# Patient Record
Sex: Male | Born: 1995 | Race: Black or African American | Hispanic: No | Marital: Single | Smoking: Never smoker
Health system: Southern US, Community
[De-identification: ages and names within clinical notes are randomized; demographics above are authoritative.]

## PROBLEM LIST (undated history)

## (undated) DIAGNOSIS — J45909 Unspecified asthma, uncomplicated: Secondary | ICD-10-CM

---
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
## 2016-06-09 ENCOUNTER — Ambulatory Visit (HOSPITAL_COMMUNITY)
Admission: EM | Admit: 2016-06-09 | Discharge: 2016-06-09 | Disposition: A | Discharge status: Home / Self Care | Payer: Self-pay | Attending: Emergency Medicine | Admitting: Emergency Medicine

## 2016-06-09 ENCOUNTER — Encounter (HOSPITAL_COMMUNITY): Payer: Self-pay | Admitting: *Deleted

## 2016-06-09 DIAGNOSIS — B9789 Other viral agents as the cause of diseases classified elsewhere: Secondary | ICD-10-CM

## 2016-06-09 DIAGNOSIS — J069 Acute upper respiratory infection, unspecified: Secondary | ICD-10-CM

## 2016-06-09 MED ORDER — PREDNISONE 50 MG PO TABS: ORAL_TABLET | ORAL | 0 refills | Status: DC

## 2016-06-09 MED ORDER — LORATADINE 10 MG PO TABS: 10.0000 mg | ORAL_TABLET | Freq: Every day | ORAL | 0 refills | Status: DC

## 2016-06-09 MED ORDER — BENZONATATE 100 MG PO CAPS: 100.0000 mg | ORAL_CAPSULE | Freq: Three times a day (TID) | ORAL | 0 refills | Status: DC

## 2016-06-09 NOTE — Discharge Instructions (Signed)
You have a upper respiratory viral infection. You may need to use your inhaler more frequently over the next few days. Take prednisone daily for 5 days. This will help with the cough and pain in your chest. Take loratadine daily to help with congestion. Use the Tessalon 3 times a day as needed for cough. You should start to feel better in the next 3 days. Follow-up as needed.

## 2016-06-09 NOTE — ED Provider Notes (Signed)
[facility]    CSN: [id number] Arrival date & time: 06/09/16  1040     History   Chief Complaint Chief Complaint  Patient presents with  . URI    HPI Steve PacasDashaun Erling ConteSwain is a 20 y.o. male.   HPI  Steve Esparza is a 20 year old man here for evaluation of cough. His symptoms started 3 days ago with cough, nasal congestion, runny nose, and intermittent sore throat. Steve Esparza reports fevers at home on Sunday and Tuesday. Developed pain in his sternal chest with coughing. Steve Esparza denies any shortness of breath. No nausea, vomiting, diarrhea. No ear pain. Steve Esparza has been drinking hot tea without improvement.  Past Medical History:  Diagnosis Date  . Asthma     There are no active problems to display for this patient.   History reviewed. No pertinent surgical history.     Home Medications    Prior to Admission medications   Medication Sig Start Date End Date Taking? Authorizing Provider  albuterol (PROVENTIL HFA;VENTOLIN HFA) 108 (90 BASE) MCG/ACT inhaler Inhale 1-2 puffs into the lungs every 6 (six) hours as needed for wheezing or shortness of breath. 06/04/13   Irish EldersKelly Walker, FNP  beclomethasone (QVAR) 80 MCG/ACT inhaler Inhale 1 puff into the lungs 2 (two) times daily.    Historical Provider, MD  benzonatate (TESSALON) 100 MG capsule Take 1 capsule (100 mg total) by mouth every 8 (eight) hours. 06/09/16   Charm RingsErin J Amiree No, MD  loratadine (CLARITIN) 10 MG tablet Take 1 tablet (10 mg total) by mouth daily. 06/09/16   Charm RingsErin J Raudel Bazen, MD  predniSONE (DELTASONE) 50 MG tablet Take 1 pill daily for 5 days. 06/09/16   Charm RingsErin J Obed Samek, MD    Family History History reviewed. No pertinent family history.  Social History Social History  Substance Use Topics  . Smoking status: Never Smoker  . Smokeless tobacco: Not on file  . Alcohol use No     Allergies   Other; Peanut-containing drug products; and Shellfish allergy   Review of Systems Review of Systems As in history of present illness  Physical  Exam Triage Vital Signs ED Triage Vitals  Enc Vitals Group     BP      Pulse      Resp      Temp      Temp src      SpO2      Weight      Height      Head Circumference      Peak Flow      Pain Score      Pain Loc      Pain Edu?      Excl. in GC?    No data found.   Updated Vital Signs BP 118/72 (BP Location: Right Arm)   Pulse 78   Temp 98.6 F (37 C) (Oral)   Resp 20   SpO2 100%   Visual Acuity Right Eye Distance:   Left Eye Distance:   Bilateral Distance:    Right Eye Near:   Left Eye Near:    Bilateral Near:     Physical Exam  Constitutional: Steve Esparza is oriented to person, place, and time. Steve Esparza appears well-developed and well-nourished. No distress.  HENT:  Mouth/Throat: Oropharynx is clear and moist. No oropharyngeal exudate.  Nasal mucosa is erythematous and boggy. TMs normal bilaterally.  Neck: Neck supple.  Cardiovascular: Normal rate, regular rhythm and normal heart sounds.   No murmur heard. Pulmonary/Chest: Effort normal and breath  sounds normal. No respiratory distress. Steve Esparza has no wheezes. Steve Esparza has no rales. Steve Esparza exhibits tenderness (distal sternum, reproduces pain).  Lymphadenopathy:    Steve Esparza has no cervical adenopathy.  Neurological: Steve Esparza is alert and oriented to person, place, and time.     UC Treatments / Results  Labs (all labs ordered are listed, but only abnormal results are displayed) Labs Reviewed - No data to display  EKG  EKG Interpretation None       Radiology No results found.  Procedures Procedures (including critical care time)  Medications Ordered in UC Medications - No data to display   Initial Impression / Assessment and Plan / UC Course  I have reviewed the triage vital signs and the nursing notes.  Pertinent labs & imaging results that were available during my care of the patient were reviewed by me and considered in my medical decision making (see chart for details).  Clinical Course     No sign of pneumonia. We'll  treat symptomatically with prednisone, loratadine, and Tessalon. Work note provided. Expect improvement in 3 days. Follow-up as needed.  Final Clinical Impressions(s) / UC Diagnoses   Final diagnoses:  Viral URI with cough    New Prescriptions New Prescriptions   BENZONATATE (TESSALON) 100 MG CAPSULE    Take 1 capsule (100 mg total) by mouth every 8 (eight) hours.   LORATADINE (CLARITIN) 10 MG TABLET    Take 1 tablet (10 mg total) by mouth daily.   PREDNISONE (DELTASONE) 50 MG TABLET    Take 1 pill daily for 5 days.     Charm RingsErin J Nabiha Planck, MD 06/09/16 1149

## 2016-06-09 NOTE — ED Triage Notes (Signed)
Pt  Reports  A  History   Of    Cough   /   Congested    And      Symptoms   X     sev  Days  Pt  Sitting  Upright  On the  Exam table  Speaking  In  Complete    sentances      In no  Severe   Distress

## 2016-12-29 ENCOUNTER — Encounter (HOSPITAL_COMMUNITY): Payer: Self-pay

## 2016-12-29 ENCOUNTER — Ambulatory Visit (HOSPITAL_COMMUNITY)
Admission: EM | Admit: 2016-12-29 | Discharge: 2016-12-29 | Disposition: A | Discharge status: Home / Self Care | Payer: Medicaid Other | Attending: Family Medicine | Admitting: Family Medicine

## 2016-12-29 DIAGNOSIS — S93401A Sprain of unspecified ligament of right ankle, initial encounter: Secondary | ICD-10-CM

## 2016-12-29 MED ORDER — NAPROXEN 500 MG PO TABS: 500.0000 mg | ORAL_TABLET | Freq: Two times a day (BID) | ORAL | 0 refills | Status: DC

## 2016-12-29 NOTE — ED Triage Notes (Signed)
[facility] of [facility] pain since February pt was seen and had to wear a boot but ankle pain continues on/off but swelling is constant, pt denies any injuries

## 2016-12-29 NOTE — ED Provider Notes (Signed)
CSN: [id number]     Arrival date & time 12/29/16  1805 History   None    Chief Complaint  Patient presents with  . Ankle Pain   (Consider location/radiation/quality/duration/timing/severity/associated sxs/prior Treatment) Patient states he rolled his ankle a few weeks ago and he is having some lateral ankle discomfort.   The history is provided by the patient.  Ankle Pain  Location:  Ankle Time since incident:  2 weeks Injury: yes   Ankle location:  R ankle Pain details:    Severity:  Moderate   Duration:  2 weeks   Timing:  Intermittent Chronicity:  Recurrent   Past Medical History:  Diagnosis Date  . Asthma    History reviewed. No pertinent surgical history. History reviewed. No pertinent family history. Social History  Substance Use Topics  . Smoking status: Never Smoker  . Smokeless tobacco: Never Used  . Alcohol use No    Review of Systems  Constitutional: Negative.   HENT: Negative.   Eyes: Negative.   Respiratory: Negative.   Cardiovascular: Negative.   Gastrointestinal: Negative.   Endocrine: Negative.   Genitourinary: Negative.   Musculoskeletal: Positive for arthralgias.  Allergic/Immunologic: Negative.   Neurological: Negative.     Allergies  Other; Peanut-containing drug products; and Shellfish allergy  Home Medications   Prior to Admission medications   Medication Sig Start Date End Date Taking? Authorizing Provider  loratadine (CLARITIN) 10 MG tablet Take 1 tablet (10 mg total) by mouth daily. 06/09/16  Yes Charm RingsHonig, Erin J, MD  albuterol (PROVENTIL HFA;VENTOLIN HFA) 108 (90 BASE) MCG/ACT inhaler Inhale 1-2 puffs into the lungs every 6 (six) hours as needed for wheezing or shortness of breath. 06/04/13   Irish EldersWalker, Kelly, FNP  beclomethasone (QVAR) 80 MCG/ACT inhaler Inhale 1 puff into the lungs 2 (two) times daily.    [provider]  benzonatate (TESSALON) 100 MG capsule Take 1 capsule (100 mg total) by mouth every 8 (eight) hours. 06/09/16    Charm RingsHonig, Erin J, MD  naproxen (NAPROSYN) 500 MG tablet Take 1 tablet (500 mg total) by mouth 2 (two) times daily with a meal. 12/29/16   [place], Anselm PancoastWilliam J, FNP  predniSONE (DELTASONE) 50 MG tablet Take 1 pill daily for 5 days. 06/09/16   Charm RingsHonig, Erin J, MD   Meds Ordered and Administered this Visit  Medications - No data to display  BP 130/86 (BP Location: Right Arm)   Pulse 70   Temp 98.5 F (36.9 C) (Oral)   Resp 16   SpO2 100%  No data found.   Physical Exam  Constitutional: He appears well-developed and well-nourished.  HENT:  Head: Normocephalic and atraumatic.  Eyes: Conjunctivae and EOM are normal. Pupils are equal, round, and reactive to light.  Neck: Normal range of motion. Neck supple.  Cardiovascular: Normal rate, regular rhythm and normal heart sounds.   Pulmonary/Chest: Effort normal.  Musculoskeletal: He exhibits tenderness.  Right lateral ankle TTP no deformity and no swelling.  Nursing note and vitals reviewed.   Urgent Care Course     Procedures (including critical care time)  Labs Review Labs Reviewed - No data to display  Imaging Review No results found.   Visual Acuity Review  Right Eye Distance:   Left Eye Distance:   Bilateral Distance:    Right Eye Near:   Left Eye Near:    Bilateral Near:         MDM   1. Sprain of right ankle, unspecified ligament, initial encounter  Naprosyn 500mg  one po bid x 10 days       Deatra Canter, [place] 12/29/16 1947

## 2017-02-20 ENCOUNTER — Emergency Department (HOSPITAL_COMMUNITY): Payer: Self-pay

## 2017-02-20 ENCOUNTER — Encounter (HOSPITAL_COMMUNITY): Payer: Self-pay | Admitting: *Deleted

## 2017-02-20 ENCOUNTER — Emergency Department (HOSPITAL_COMMUNITY)
Admission: EM | Admit: 2017-02-20 | Discharge: 2017-02-20 | Disposition: A | Discharge status: Home / Self Care | Payer: Self-pay | Attending: Emergency Medicine | Admitting: Emergency Medicine

## 2017-02-20 DIAGNOSIS — Z79899 Other long term (current) drug therapy: Secondary | ICD-10-CM | POA: Insufficient documentation

## 2017-02-20 DIAGNOSIS — Y9367 Activity, basketball: Secondary | ICD-10-CM | POA: Insufficient documentation

## 2017-02-20 DIAGNOSIS — Z91010 Allergy to peanuts: Secondary | ICD-10-CM | POA: Insufficient documentation

## 2017-02-20 DIAGNOSIS — S43491A Other sprain of right shoulder joint, initial encounter: Secondary | ICD-10-CM | POA: Insufficient documentation

## 2017-02-20 DIAGNOSIS — Y92310 Basketball court as the place of occurrence of the external cause: Secondary | ICD-10-CM | POA: Insufficient documentation

## 2017-02-20 DIAGNOSIS — Y998 Other external cause status: Secondary | ICD-10-CM | POA: Insufficient documentation

## 2017-02-20 DIAGNOSIS — J45909 Unspecified asthma, uncomplicated: Secondary | ICD-10-CM | POA: Insufficient documentation

## 2017-02-20 DIAGNOSIS — W51XXXA Accidental striking against or bumped into by another person, initial encounter: Secondary | ICD-10-CM | POA: Insufficient documentation

## 2017-02-20 MED ORDER — IBUPROFEN 800 MG PO TABS
800.0000 mg | ORAL_TABLET | Freq: Once | ORAL | Status: AC
  Administered 2017-02-20: 800 mg via ORAL
  Filled 2017-02-20: qty 1

## 2017-02-20 MED ORDER — IBUPROFEN 800 MG PO TABS: 800.0000 mg | ORAL_TABLET | Freq: Three times a day (TID) | ORAL | 0 refills | Status: DC | PRN

## 2017-02-20 NOTE — ED Notes (Signed)
Declined W/C at D/C and was escorted to lobby by RN. 

## 2017-02-20 NOTE — ED Provider Notes (Signed)
[facility] Provider Note   CSN: [id number] Arrival date & time: 02/20/17  0909     History   Chief Complaint Chief Complaint  Patient presents with  . Shoulder Injury    HPI Linell Salminen is a 21 y.o. male presenting after basketball injury in which he fell backwards to the ground and the other player fell on top of his right shoulder. He is here with aching right shoulder pain. Denies any head trauma, loss of consciousness or other injuries, no numbness or weakness in his upper extremities.   HPI  Past Medical History:  Diagnosis Date  . Asthma     There are no active problems to display for this patient.   History reviewed. No pertinent surgical history.     Home Medications    Prior to Admission medications   Medication Sig Start Date End Date Taking? Authorizing Provider  albuterol (PROVENTIL HFA;VENTOLIN HFA) 108 (90 BASE) MCG/ACT inhaler Inhale 1-2 puffs into the lungs every 6 (six) hours as needed for wheezing or shortness of breath. 06/04/13   Irish Elders, FNP  beclomethasone (QVAR) 80 MCG/ACT inhaler Inhale 1 puff into the lungs 2 (two) times daily.    [provider]  benzonatate (TESSALON) 100 MG capsule Take 1 capsule (100 mg total) by mouth every 8 (eight) hours. 06/09/16   Charm Rings, MD  ibuprofen (ADVIL,MOTRIN) 800 MG tablet Take 1 tablet (800 mg total) by mouth every 8 (eight) hours as needed. 02/20/17   Georgiana Shore, PA-C  loratadine (CLARITIN) 10 MG tablet Take 1 tablet (10 mg total) by mouth daily. 06/09/16   Charm Rings, MD  naproxen (NAPROSYN) 500 MG tablet Take 1 tablet (500 mg total) by mouth 2 (two) times daily with a meal. 12/29/16   Oxford, Anselm Pancoast, FNP  predniSONE (DELTASONE) 50 MG tablet Take 1 pill daily for 5 days. 06/09/16   Charm Rings, MD    Family History No family history on file.  Social History Social History  Substance Use Topics  . Smoking status: Never Smoker  . Smokeless tobacco: Never Used    . Alcohol use No     Allergies   Other; Peanut-containing drug products; and Shellfish allergy   Review of Systems Review of Systems  Musculoskeletal: Positive for arthralgias. Negative for joint swelling, neck pain and neck stiffness.  Skin: Negative for color change, pallor and wound.  Neurological: Negative for weakness and numbness.     Physical Exam Updated Vital Signs BP (!) 147/76   Pulse 70   Temp 98.1 F (36.7 C) (Oral)   Resp 16   Wt 86.2 kg (190 lb)   SpO2 100%   Physical Exam  Constitutional: He appears well-developed and well-nourished. No distress.  Afebrile, nontoxic-appearing, sitting comfortably in bed in no acute distress.  HENT:  Head: Normocephalic and atraumatic.  Eyes: Conjunctivae are normal.  Neck: Neck supple.  Cardiovascular: Normal rate, regular rhythm, normal heart sounds and intact distal pulses.   No murmur heard. Pulmonary/Chest: Effort normal and breath sounds normal. No respiratory distress. He has no wheezes. He has no rales.  Abdominal: He exhibits no distension.  Musculoskeletal: Normal range of motion. He exhibits tenderness. He exhibits no edema or deformity.  Tenderness over the acromioclavicular joint. No bony tenderness elsewhere. Full range of motion of the shoulder.  Neurological: He is alert. No sensory deficit. He exhibits normal muscle tone.  5 out of 5 strength to abduction and abduction, elbow flexion and  extension, grips bilaterally. Sensation intact. strong radial pulses bilaterally. Vascularly intact  Skin: Skin is warm and dry. No rash noted. He is not diaphoretic. No erythema. No pallor.  Psychiatric: He has a normal mood and affect.  Nursing note and vitals reviewed.    ED Treatments / Results  Labs (all labs ordered are listed, but only abnormal results are displayed) Labs Reviewed - No data to display  EKG  EKG Interpretation None       Radiology Dg Shoulder Right  Result Date:  02/20/2017 CLINICAL DATA:  Pt was playing basketball yesterday when he fell and someone fell on him injuring his right shoulder. Pt has continued pain in right shoulder, increased with movement (most pain when lifting arm) and pt has bruise to shoulder. No relief with ibuprofen and ice at home. No previous injuries to right shoulder before. EXAM: RIGHT SHOULDER - 2+ VIEW COMPARISON:  None. FINDINGS: No fracture. On the AP view, the distal clavicle projects superior to the acromion by approximately 7 mm. This suggests an AC joint sprain. The glenohumeral joint is normally spaced and aligned. Soft tissues are unremarkable. IMPRESSION: 1. No fracture or dislocation. 2. Findings suggest an [facility] joint sprain. Electronically Signed   By: Amie Portland M.D.   On: 02/20/2017 09:41    Procedures Procedures (including critical care time)  Medications Ordered in ED Medications  ibuprofen (ADVIL,MOTRIN) tablet 800 mg (not administered)     Initial Impression / Assessment and Plan / ED Course  I have reviewed the triage vital signs and the nursing notes.  Pertinent labs & imaging results that were available during my care of the patient were reviewed by me and considered in my medical decision making (see chart for details).    Patient presents with right shoulder sprain after a basketball injury. No other injuries.  X-ray without evidence of dislocation or fracture. Suspicious for acromioclavicular sprain.  Patient's pain was managed while in the emergency department. He is otherwise well-appearing nontoxic with normal vital signs and stable.  Patient was provided with a sling for comfort. Rice protocol and considered and discussed.  Discharge home with follow-up with PCP as needed.  Discussed strict return precautions and advised to return to the emergency department if experiencing any new or worsening symptoms. Instructions were understood and patient agreed with discharge plan. Final Clinical  Impressions(s) / ED Diagnoses   Final diagnoses:  Sprain of other part of right shoulder region, initial encounter    New Prescriptions New Prescriptions   IBUPROFEN (ADVIL,MOTRIN) 800 MG TABLET    Take 1 tablet (800 mg total) by mouth every 8 (eight) hours as needed.     Georgiana Shore, PA-C 02/20/17 1226    Arby Barrette, MD 03/01/17 1739

## 2017-02-20 NOTE — ED Triage Notes (Signed)
PT updated on X-ray results

## 2017-02-20 NOTE — Discharge Instructions (Signed)
As discussed, wear your sling for support and comfort.  RICE protocol as outlines in these instructions.  Ibuprofen for pain and swelling.  Follow up with Primary care provider in a week as needed. Return if worsening, loss of sensation, numbness, or other concerning symptoms in the meantime.

## 2017-02-20 NOTE — ED Triage Notes (Signed)
Pt was playing basketball yesterday when he fell and someone fell on him injuring his right shoulder.  Pt has continued pain in right shoulder, increased with movement (most pain when lifting arm) and pt has bruise to shoulder.  No relief with ibuprofen and ice at home.

## 2017-04-16 ENCOUNTER — Ambulatory Visit (HOSPITAL_COMMUNITY)
Admission: EM | Admit: 2017-04-16 | Discharge: 2017-04-16 | Disposition: A | Discharge status: Home / Self Care | Payer: Self-pay | Attending: Internal Medicine | Admitting: Internal Medicine

## 2017-04-16 ENCOUNTER — Encounter (HOSPITAL_COMMUNITY): Payer: Self-pay | Admitting: Emergency Medicine

## 2017-04-16 DIAGNOSIS — Z23 Encounter for immunization: Secondary | ICD-10-CM

## 2017-04-16 DIAGNOSIS — S81802A Unspecified open wound, left lower leg, initial encounter: Secondary | ICD-10-CM

## 2017-04-16 DIAGNOSIS — W2209XA Striking against other stationary object, initial encounter: Secondary | ICD-10-CM

## 2017-04-16 DIAGNOSIS — S8011XA Contusion of right lower leg, initial encounter: Secondary | ICD-10-CM

## 2017-04-16 DIAGNOSIS — S81812A Laceration without foreign body, left lower leg, initial encounter: Secondary | ICD-10-CM

## 2017-04-16 MED ORDER — LIDOCAINE-EPINEPHRINE-TETRACAINE (LET) SOLUTION
NASAL | Status: AC
  Filled 2017-04-16: qty 3

## 2017-04-16 MED ORDER — TETANUS-DIPHTH-ACELL PERTUSSIS 5-2.5-18.5 LF-MCG/0.5 IM SUSP
0.5000 mL | Freq: Once | INTRAMUSCULAR | Status: AC
  Administered 2017-04-16: 0.5 mL via INTRAMUSCULAR

## 2017-04-16 MED ORDER — TETANUS-DIPHTH-ACELL PERTUSSIS 5-2.5-18.5 LF-MCG/0.5 IM SUSP
INTRAMUSCULAR | Status: AC
  Filled 2017-04-16: qty 0.5

## 2017-04-16 MED ORDER — LIDOCAINE-EPINEPHRINE-TETRACAINE (LET) SOLUTION
3.0000 mL | Freq: Once | NASAL | Status: AC
  Administered 2017-04-16: 3 mL via TOPICAL

## 2017-04-16 NOTE — ED Triage Notes (Signed)
Playing basketball today, ran into bleachers and has laceration to left lower leg.  Bleeding controlled

## 2017-04-16 NOTE — Discharge Instructions (Addendum)
Wash wound on left lower leg gently with soap/water 1-2 times daily, apply antibiotic ointment and bandage.  Recheck if any increasing redness/swelling/pain/drainage or new fever>100.5, or if not starting to improve in a few days.  Anticipate gradual healing of the wound over the next 2-4 weeks; bumps and bruises on the lower legs can take longer than that to resolve.  Tetanus booster given today at the urgent care.

## 2017-04-18 NOTE — ED Provider Notes (Signed)
[facility]    CSN: [id number] Arrival date & time: 04/16/17  1334     History   Chief Complaint Chief Complaint  Patient presents with  . Laceration    HPI Steve Esparza is a 21 y.o. male. He presents today after barking both shins on some bleachers at a basketball game this afternoon, just walked into the bleachers.  R mid shin with a small swollen/tender lump; L mid shin with a small skin avulsion (bleeding controlled).  No other injuries reported.      HPI  Past Medical History:  Diagnosis Date  . Asthma    History reviewed. No pertinent surgical history.     Home Medications    Prior to Admission medications   Medication Sig Start Date End Date Taking? Authorizing Provider  albuterol (PROVENTIL HFA;VENTOLIN HFA) 108 (90 BASE) MCG/ACT inhaler Inhale 1-2 puffs into the lungs every 6 (six) hours as needed for wheezing or shortness of breath. 06/04/13   Irish Elders, FNP  beclomethasone (QVAR) 80 MCG/ACT inhaler Inhale 1 puff into the lungs 2 (two) times daily.    [provider]  benzonatate (TESSALON) 100 MG capsule Take 1 capsule (100 mg total) by mouth every 8 (eight) hours. 06/09/16   Charm Rings, MD  ibuprofen (ADVIL,MOTRIN) 800 MG tablet Take 1 tablet (800 mg total) by mouth every 8 (eight) hours as needed. 02/20/17   Georgiana Shore, PA-C  loratadine (CLARITIN) 10 MG tablet Take 1 tablet (10 mg total) by mouth daily. 06/09/16   Charm Rings, MD  naproxen (NAPROSYN) 500 MG tablet Take 1 tablet (500 mg total) by mouth 2 (two) times daily with a meal. 12/29/16   Oxford, Anselm Pancoast, FNP    Family History No family history on file.  Social History Social History  Substance Use Topics  . Smoking status: Never Smoker  . Smokeless tobacco: Never Used  . Alcohol use No     Allergies   Other; Peanut-containing drug products; and Shellfish allergy   Review of Systems Review of Systems  All other systems reviewed and are  negative.    Physical Exam Triage Vital Signs ED Triage Vitals  Enc Vitals Group     BP 04/16/17 1437 (!) 108/58     Pulse Rate 04/16/17 1437 78     Resp 04/16/17 1437 18     Temp 04/16/17 1437 98.8 F (37.1 C)     Temp Source 04/16/17 1437 Oral     SpO2 04/16/17 1437 100 %     Weight --      Height --      Pain Score 04/16/17 1435 6     Pain Loc --    Updated Vital Signs BP (!) 108/58 (BP Location: Left Arm)   Pulse 78   Temp 98.8 F (37.1 C) (Oral)   Resp 18   SpO2 100%   Physical Exam  Constitutional: He is oriented to person, place, and time. No distress.  Alert, nicely groomed  HENT:  Head: Atraumatic.  Eyes:  Conjugate gaze, no eye redness/drainage  Neck: Neck supple.  Cardiovascular: Normal rate.   Pulmonary/Chest: No respiratory distress.  Abdominal: He exhibits no distension.  Musculoskeletal: Normal range of motion.  R mid shin with a 1.5" tender/bruised swelling  L mid shin with a 0.5" area of avulsed skin.  Neurological: He is alert and oriented to person, place, and time.  Able to walk into the urgent care independently.   Skin: Skin  is warm and dry.  No cyanosis  Nursing note and vitals reviewed.    UC Treatments / Results   Procedures Procedures (including critical care time)  Medications Ordered in UC Medications  lidocaine-EPINEPHrine-tetracaine (LET) solution (3 mLs Topical Given 04/16/17 1551)  Tdap (BOOSTRIX) injection 0.5 mL (0.5 mLs Intramuscular Given 04/16/17 1628)   Washed wound on L midshin with hibiclens/saline and irrigated with saline.  Wound was not suturable.  Antibiotic ointment and a bandage applied.      Final Clinical Impressions(s) / UC Diagnoses   Final diagnoses:  Contusion of right lower leg, initial encounter  Avulsion of skin of left lower leg, initial encounter   Wash wound on left lower leg gently with soap/water 1-2 times daily, apply antibiotic ointment and bandage.  Recheck if any increasing  redness/swelling/pain/drainage or new fever>100.5, or if not starting to improve in a few days.  Anticipate gradual healing of the wound over the next 2-4 weeks; bumps and bruises on the lower legs can take longer than that to resolve.  Tetanus booster given today at the urgent care.     Controlled Substance Prescriptions [place] Controlled Substance Registry consulted? No   Eustace Moore, MD 04/18/17 [phone]

## 2017-12-29 IMAGING — DX DG SHOULDER 2+V*R*
3 series · 3 of 3 positions shown · non-contrast
Comparison: None.

CLINICAL DATA: Pt was playing basketball yesterday when he fell and
someone fell on him injuring his right shoulder. Pt has continued
pain in right shoulder, increased with movement (most pain when
lifting arm) and pt has bruise to shoulder. No relief with ibuprofen
and ice at home. No previous injuries to right shoulder before.

EXAM:
RIGHT SHOULDER - 2+ VIEW

[shoulder grashey]
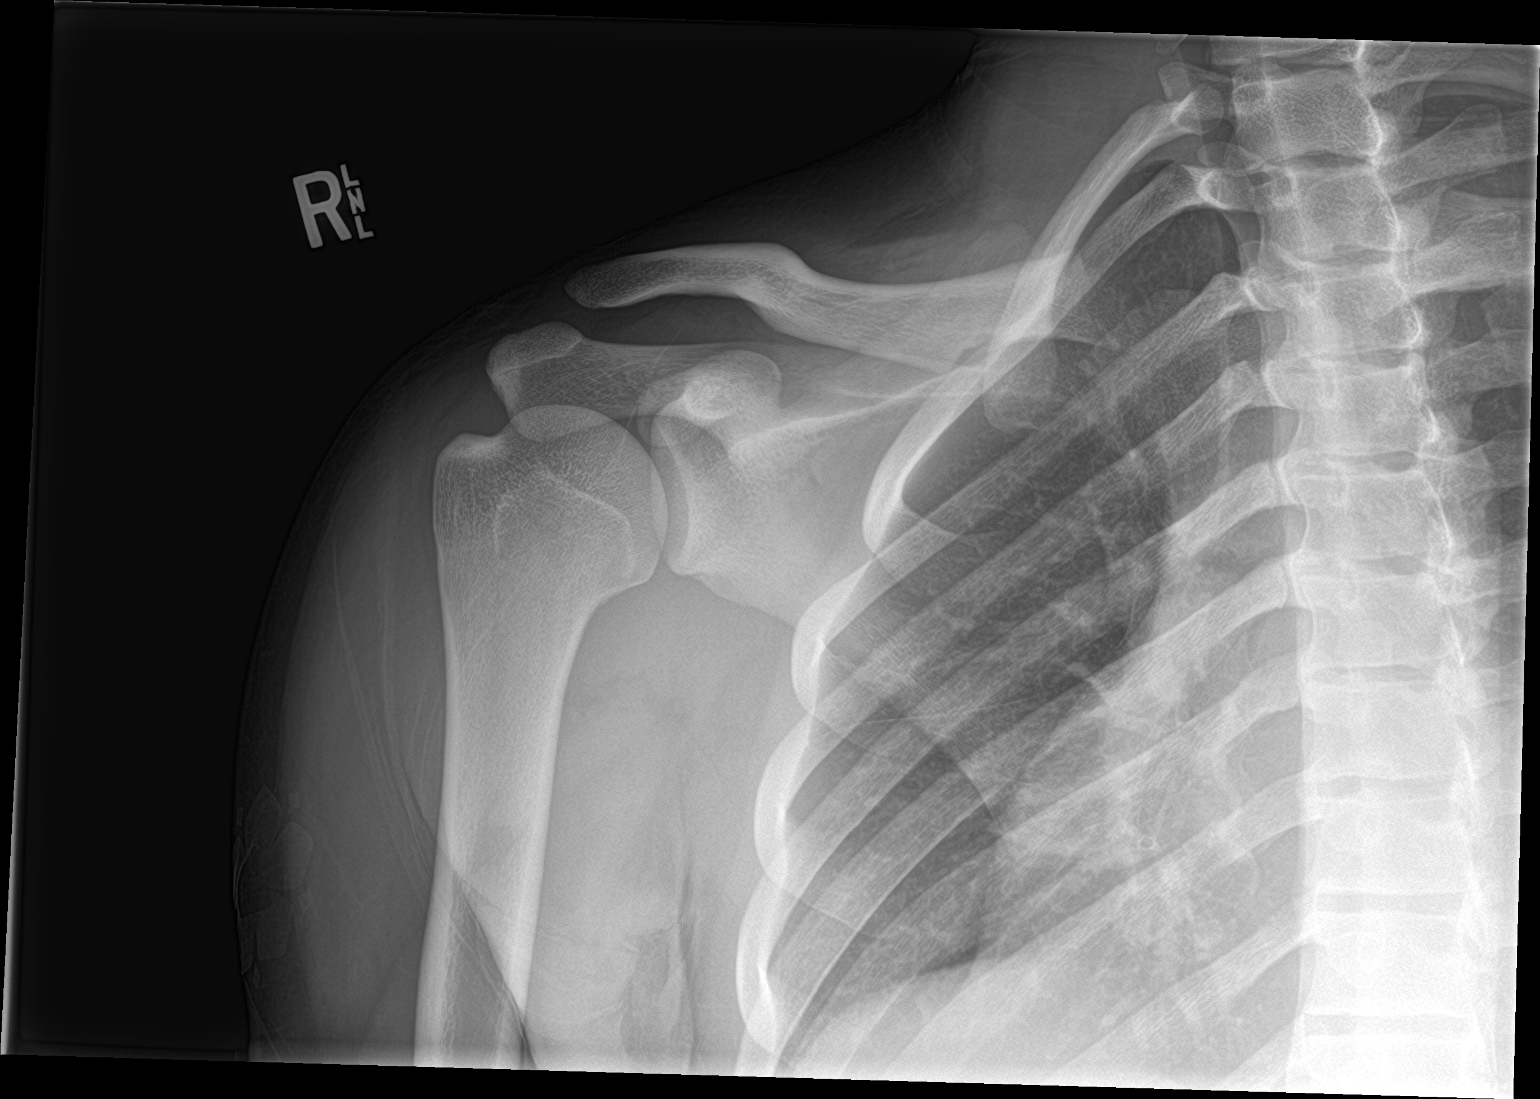

[shoulder y view]
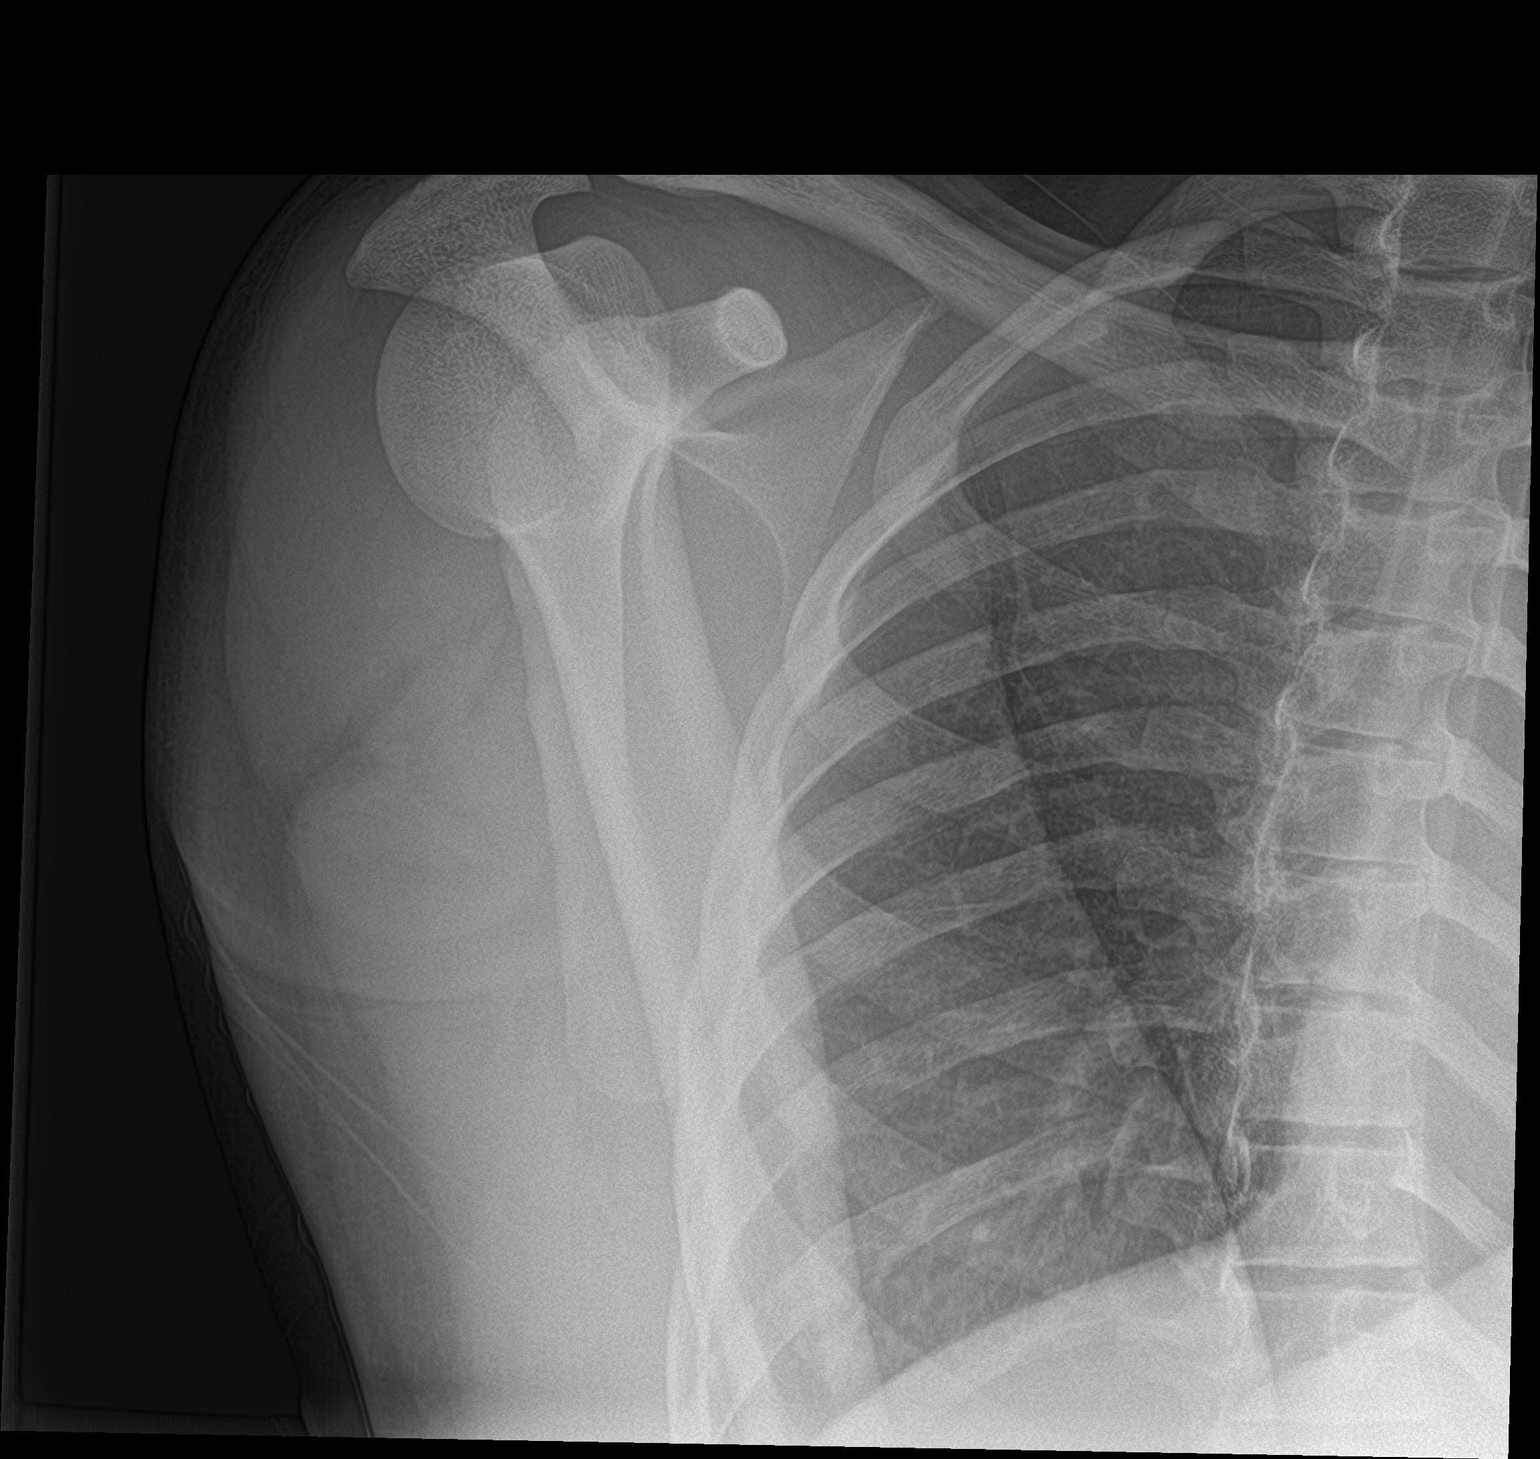

[shoulder axillary]
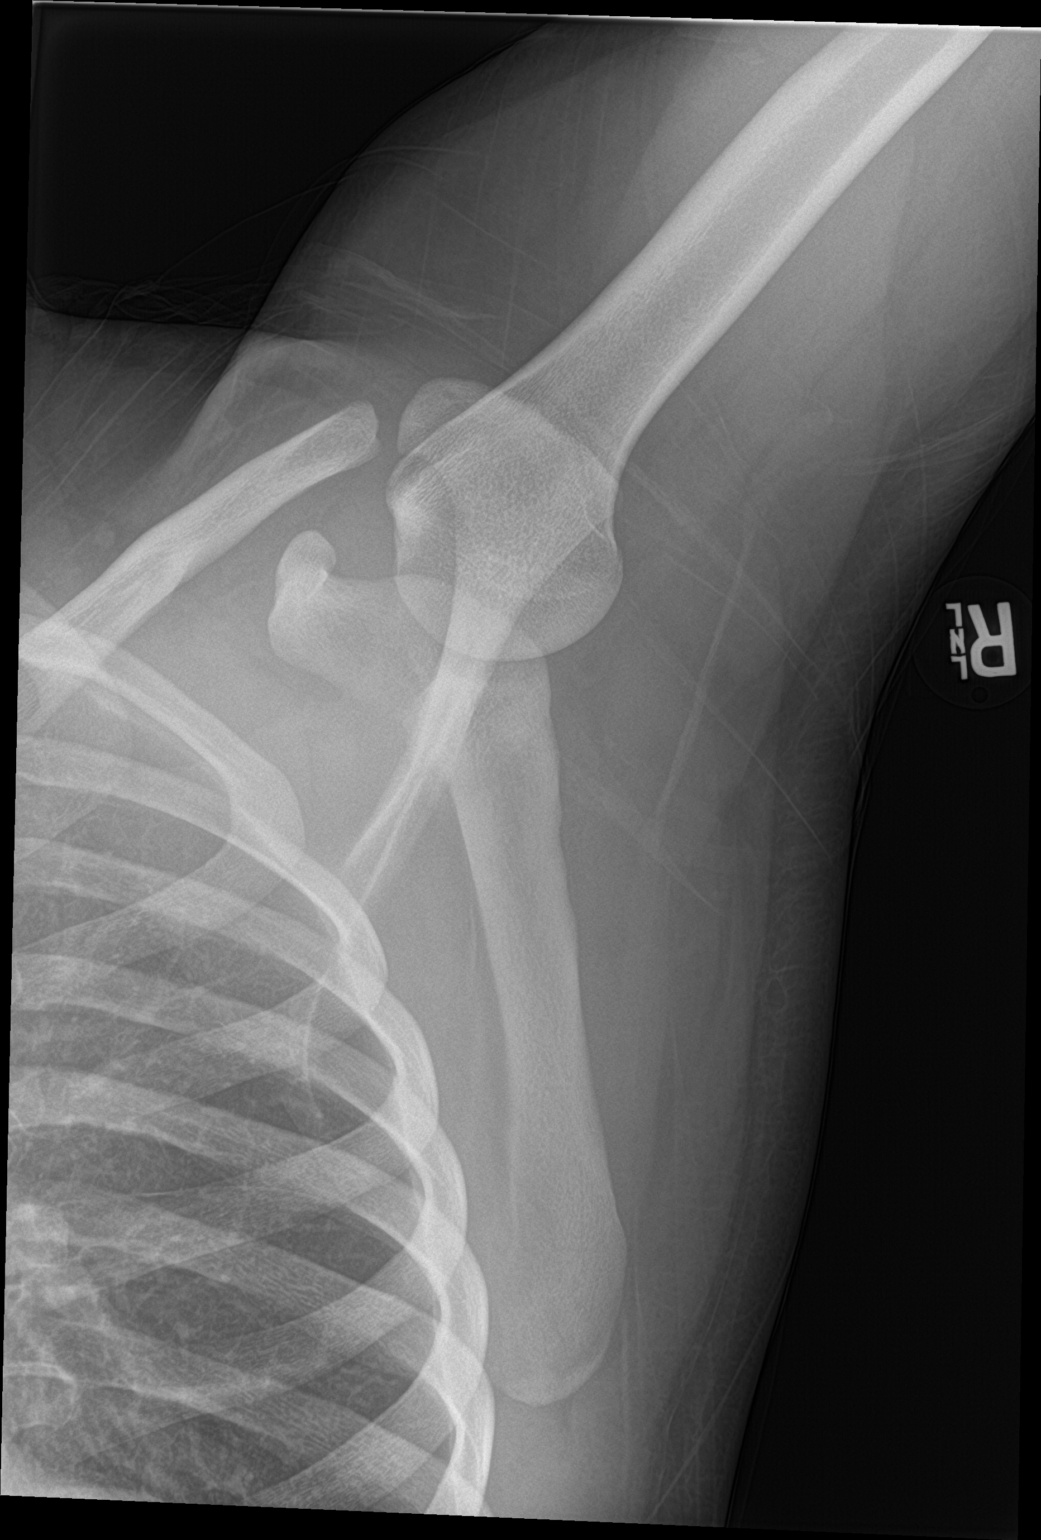

[3 of 3 positions shown; findings below may reference images not displayed]

FINDINGS: No fracture.

On the AP view, the distal clavicle projects superior to the
acromion by approximately 7 mm. This suggests an AC joint sprain.

The glenohumeral joint is normally spaced and aligned.

Soft tissues are unremarkable.
IMPRESSION: 1. No fracture or dislocation.
2. Findings suggest an AC joint sprain.

## 2018-06-08 ENCOUNTER — Ambulatory Visit (HOSPITAL_COMMUNITY)
Admission: EM | Admit: 2018-06-08 | Discharge: 2018-06-08 | Disposition: A | Discharge status: Home / Self Care | Payer: Self-pay | Attending: Family Medicine | Admitting: Family Medicine

## 2018-06-08 ENCOUNTER — Other Ambulatory Visit: Payer: Self-pay

## 2018-06-08 ENCOUNTER — Encounter (HOSPITAL_COMMUNITY): Payer: Self-pay

## 2018-06-08 DIAGNOSIS — G43019 Migraine without aura, intractable, without status migrainosus: Secondary | ICD-10-CM

## 2018-06-08 MED ORDER — ONDANSETRON 4 MG PO TBDP
4.0000 mg | ORAL_TABLET | Freq: Once | ORAL | Status: AC
  Administered 2018-06-08: 4 mg via ORAL

## 2018-06-08 MED ORDER — ONDANSETRON 4 MG PO TBDP
ORAL_TABLET | ORAL | Status: AC
  Filled 2018-06-08: qty 1

## 2018-06-08 MED ORDER — ONDANSETRON HCL 4 MG PO TABS: 4.0000 mg | ORAL_TABLET | Freq: Four times a day (QID) | ORAL | 0 refills | Status: DC

## 2018-06-08 MED ORDER — KETOROLAC TROMETHAMINE 60 MG/2ML IM SOLN
INTRAMUSCULAR | Status: AC
  Filled 2018-06-08: qty 2

## 2018-06-08 MED ORDER — IBUPROFEN 800 MG PO TABS: 800.0000 mg | ORAL_TABLET | Freq: Three times a day (TID) | ORAL | 0 refills | Status: DC | PRN

## 2018-06-08 MED ORDER — KETOROLAC TROMETHAMINE 60 MG/2ML IM SOLN
60.0000 mg | Freq: Once | INTRAMUSCULAR | Status: AC
  Administered 2018-06-08: 60 mg via INTRAMUSCULAR

## 2018-06-08 NOTE — ED Provider Notes (Signed)
[facility]    CSN: [id number] Arrival date & time: 06/08/18  0805     History   Chief Complaint Chief Complaint  Patient presents with  . Headache    HPI Steve Esparza is a 22 y.o. male.   HPI  Patient states he has a long history of migraines.  This 1 is been lasting for most a week.  It got really severe last night.  He called EMS..  They did vital signs and told him he was stable.  Offered him a ride to the emergency room and he declined.  He has never had to come in for a shot for headache before.  The headache is frontal.  Photophobia.  Mild nausea.  No scotomata.  No phonophobia.  He has not vomited.  He is here for treatment.  He tried over-the-counter Tylenol which did not help.  He does not have any other medicine to take for headache.  He does not have a PCP.  This is recommended for him. No change in mentation.  No numbness or weakness in arms and legs.  No worrisome symptoms.  Past Medical History:  Diagnosis Date  . Asthma     There are no active problems to display for this patient.   History reviewed. No pertinent surgical history.     Home Medications    Prior to Admission medications   Medication Sig Start Date End Date Taking? Authorizing Provider  beclomethasone (QVAR) 80 MCG/ACT inhaler Inhale 1 puff into the lungs 2 (two) times daily.    [provider]  ibuprofen (ADVIL,MOTRIN) 800 MG tablet Take 1 tablet (800 mg total) by mouth every 8 (eight) hours as needed. 06/08/18   Eustace MooreNelson, Kourtney Terriquez Sue, MD  ondansetron (ZOFRAN) 4 MG tablet Take 1 tablet (4 mg total) by mouth every 6 (six) hours. 06/08/18   Eustace MooreNelson, Evadean Sproule Sue, MD    Family History History reviewed. No pertinent family history. Father has a history of migraines Social History Social History   Tobacco Use  . Smoking status: Never Smoker  . Smokeless tobacco: Never Used  Substance Use Topics  . Alcohol use: No  . Drug use: Not on file     Allergies   Other;  Peanut-containing drug products; and Shellfish allergy   Review of Systems Review of Systems  Constitutional: Negative for chills and fever.  HENT: Negative for ear pain and sore throat.   Eyes: Positive for photophobia. Negative for pain and visual disturbance.  Respiratory: Negative for cough and shortness of breath.   Cardiovascular: Negative for chest pain and palpitations.  Gastrointestinal: Positive for nausea. Negative for abdominal pain and vomiting.  Genitourinary: Negative for dysuria and hematuria.  Musculoskeletal: Negative for arthralgias and back pain.  Skin: Negative for color change and rash.  Neurological: Positive for headaches. Negative for seizures and syncope.  All other systems reviewed and are negative.    Physical Exam Triage Vital Signs ED Triage Vitals  Enc Vitals Group     BP 06/08/18 0817 (!) 149/86     Pulse Rate 06/08/18 0817 81     Resp 06/08/18 0817 15     Temp 06/08/18 0817 98.7 F (37.1 C)     Temp Source 06/08/18 0817 Oral     SpO2 06/08/18 0817 100 %     Weight 06/08/18 0817 175 lb (79.4 kg)     Height --      Head Circumference --      Peak Flow --  Pain Score 06/08/18 0816 0     Pain Loc --      Pain Edu? --      Excl. in GC? --    No data found.  Updated Vital Signs BP (!) 149/86 (BP Location: Left Arm)   Pulse 81   Temp 98.7 F (37.1 C) (Oral)   Resp 15   Wt 79.4 kg   SpO2 100%    Physical Exam  Constitutional: He is oriented to person, place, and time. He appears well-developed and well-nourished. No distress.  HENT:  Head: Normocephalic and atraumatic.  Mouth/Throat: Oropharynx is clear and moist.  Eyes: Pupils are equal, round, and reactive to light. Conjunctivae are normal. Right eye exhibits no nystagmus. Left eye exhibits no nystagmus. Right pupil is round and reactive. Left pupil is round and reactive.  Disks are flat  Neck: Normal range of motion. Neck supple. No neck rigidity.  Cardiovascular: Normal rate,  regular rhythm, normal heart sounds and intact distal pulses.  Pulmonary/Chest: Effort normal and breath sounds normal. No respiratory distress.  Abdominal: Soft. Bowel sounds are normal. He exhibits no distension.  Musculoskeletal: Normal range of motion. He exhibits no edema.  Neurological: He is alert and oriented to person, place, and time. He has normal strength. He displays normal reflexes. Coordination and gait normal.  Skin: Skin is warm and dry.  Psychiatric: He has a normal mood and affect. His behavior is normal.     UC Treatments / Results  Labs (all labs ordered are listed, but only abnormal results are displayed) Labs Reviewed - No data to display  EKG None  Radiology No results found.  Procedures Procedures (including critical care time)  Medications Ordered in UC Medications  ketorolac (TORADOL) injection 60 mg (has no administration in time range)  ondansetron (ZOFRAN-ODT) disintegrating tablet 4 mg (has no administration in time range)    Initial Impression / Assessment and Plan / UC Course  I have reviewed the triage vital signs and the nursing notes.  Pertinent labs & imaging results that were available during my care of the patient were reviewed by me and considered in my medical decision making (see chart for details).    Discussed benign exam.  Reasons for coming to the emergency room or hospital.  Follow-up with PCP.  Treatment options for home. Final Clinical Impressions(s) / UC Diagnoses   Final diagnoses:  Intractable migraine without aura and without status migrainosus     Discharge Instructions     Take ibuprofen up to 3 times a day as needed for headache Take Zofran if you have nausea May take over-the-counter Excedrin Migraine After you have taken your pain medication/nausea medicines and try to lie down and rest I recommend you see a primary care doctor   ED Prescriptions    Medication Sig Dispense Auth. Provider   ibuprofen  (ADVIL,MOTRIN) 800 MG tablet Take 1 tablet (800 mg total) by mouth every 8 (eight) hours as needed. 21 tablet Eustace Moore, MD   ondansetron (ZOFRAN) 4 MG tablet Take 1 tablet (4 mg total) by mouth every 6 (six) hours. 12 tablet Eustace Moore, MD     Controlled Substance Prescriptions [place] Controlled Substance Registry consulted? Not Applicable   Eustace Moore, MD 06/08/18 [phone]

## 2018-06-08 NOTE — Discharge Instructions (Addendum)
Take ibuprofen up to 3 times a day as needed for headache Take Zofran if you have nausea May take over-the-counter Excedrin Migraine After you have taken your pain medication/nausea medicines and try to lie down and rest I recommend you see a primary care doctor

## 2018-06-08 NOTE — ED Triage Notes (Signed)
Pt cc pt states he has had a headache for a week or more. The head got really bad last night he had to call the EMS.

## 2018-10-05 ENCOUNTER — Ambulatory Visit: Payer: Self-pay | Admitting: Podiatry

## 2018-10-06 ENCOUNTER — Ambulatory Visit: Payer: Self-pay | Admitting: Podiatry

## 2023-06-14 ENCOUNTER — Ambulatory Visit
Admission: EM | Admit: 2023-06-14 | Discharge: 2023-06-14 | Disposition: A | Discharge status: Home / Self Care | Payer: 59 | Attending: Internal Medicine | Admitting: Internal Medicine

## 2023-06-14 ENCOUNTER — Other Ambulatory Visit: Payer: Medicaid Other

## 2023-06-14 ENCOUNTER — Ambulatory Visit: Payer: 59

## 2023-06-14 ENCOUNTER — Telehealth: Payer: Self-pay | Admitting: Internal Medicine

## 2023-06-14 DIAGNOSIS — S63642A Sprain of metacarpophalangeal joint of left thumb, initial encounter: Secondary | ICD-10-CM

## 2023-06-14 DIAGNOSIS — M79642 Pain in left hand: Secondary | ICD-10-CM

## 2023-06-14 NOTE — ED Triage Notes (Signed)
Pt with left thumb pain after hyperextending thumb this morning. Reports hearing a 'pop' during injury and now with limited ROM. Home remedies: none

## 2023-06-14 NOTE — Discharge Instructions (Signed)
Your x-rays showed no broken bones or dislocations. They were sent to a radiologist for further evaluation and we will call you if the radiologist sees anything different.   Recommend rest, ice, compression, and elevation. You can alternate Tylenol and NSAIDs (Ibuprofen, Alleve) as directed for pain.   You were provided with a brace/splint today in office. I recommend you wear it for the next week. If no improvement within that time, you should follow up with orthopedics.   It is very important for you to pay attention to any new symptoms or worsening of your current condition. Please go directly to the Emergency Department immediately should you begin to feel worse in any way or have any of the following symptoms: fevers, increased pain, increased swelling, increased redness, or color changes.

## 2023-06-14 NOTE — ED Provider Notes (Signed)
[facility]  Note:  This document was prepared using Dragon voice recognition software and may include unintentional dictation errors.  MRN: [id number] DOB: 1996-04-22 DATE: 06/14/23   Subjective:  Chief Complaint:  Chief Complaint  Patient presents with   Hand Injury     HPI: Steve Esparza is a 27 y.o. male presenting for pain in his left thumb for less than one day. Patient states he was working on a car when he hyperextend his left thumb. He states it happened about 2 hours ago. He heard a loud pop when it happened. Patient states gripping/grasping makes his pain worse. he has not taken anything for his symptoms. No prior injuries or fractures to his left thumb. Patient is right hand dominate. Denies fever, nausea/vomiting, numbness/tingling. Endorses left thumb pain. Presents NAD.  Prior to Admission medications   Medication Sig Start Date End Date Taking? Authorizing Provider  beclomethasone (QVAR) 80 MCG/ACT inhaler Inhale 1 puff into the lungs 2 (two) times daily.    [provider]     Allergies  Allergen Reactions   Other     Seafood causes anaphylaxis   Peanut-Containing Drug Products    Shellfish Allergy     History:   Past Medical History:  Diagnosis Date   Asthma      History reviewed. No pertinent surgical history.  History reviewed. No pertinent family history.  Social History   Tobacco Use   Smoking status: Never   Smokeless tobacco: Never  Substance Use Topics   Alcohol use: No    Review of Systems  Constitutional:  Negative for fever.  Gastrointestinal:  Negative for nausea and vomiting.  Musculoskeletal:  Positive for arthralgias.  Neurological:  Negative for numbness.     Objective:   Vitals: BP (!) 151/90 (BP Location: Right Arm)   Pulse 75   Temp 97.8 F (36.6 C) (Oral)   Resp 16   SpO2 97%   Physical Exam Constitutional:      General: He is not in acute distress.    Appearance: Normal appearance. He is  well-developed and overweight. He is not ill-appearing or toxic-appearing.  HENT:     Head: Normocephalic and atraumatic.  Cardiovascular:     Rate and Rhythm: Normal rate and regular rhythm.     Pulses:          Radial pulses are 2+ on the left side.     Heart sounds: Normal heart sounds.  Pulmonary:     Effort: Pulmonary effort is normal.     Breath sounds: Normal breath sounds.     Comments: Clear to auscultation bilaterally  Abdominal:     General: Bowel sounds are normal.     Palpations: Abdomen is soft.     Tenderness: There is no abdominal tenderness.  Musculoskeletal:     Left hand: Tenderness present. Decreased range of motion. Normal pulse.     Comments: Tenderness to palpation of left 1st MCP.  Decreased range of motion due to pain with flexion.  Neurovascular intact.  No warmth, erythema, discharge.  Skin:    General: Skin is warm and dry.  Neurological:     General: No focal deficit present.     Mental Status: He is alert.  Psychiatric:        Mood and Affect: Mood and affect normal.     Results:  Labs: No results found for this or any previous visit (from the past 24 hour(s)).  Radiology: No results found.  UC Course/Treatments:  Procedures: Procedures   Medications Ordered in UC: Medications - No data to display   Assessment and Plan :     ICD-10-CM   1. Sprain of metacarpophalangeal (MCP) joint of left thumb, initial encounter  Z61.096E      Sprain of metacarpophalangeal joint of left thumb, initial encounter Afebrile, nontoxic-appearing, NAD. VSS. DDX includes but not limited to: Sprain, fracture, dislocation Imaging was negative for fracture or dislocation. Splint applied to thumb. Recommend follow up with orthopedics if no improvement after this week. Recommend RICE regimen and OTC analgesics as needed for pain. Strict ED precautions were given and patient verbalized understanding.  ED Discharge Orders     None        PDMP not reviewed  this encounter.     Jed Kutch P, PA-C 06/14/23 1041

## 2023-06-14 NOTE — Telephone Encounter (Signed)
Over read of images reviewed. No change in treatment. Continue with instructions provided at discharge.
